# Patient Record
Sex: Female | Born: 1970 | Race: White | Hispanic: No | Marital: Married
Health system: Southern US, Community
[De-identification: ages and names within clinical notes are randomized; demographics above are authoritative.]

---

## 2000-03-30 ENCOUNTER — Other Ambulatory Visit: Admission: RE | Admit: 2000-03-30 | Discharge: 2000-03-30 | Payer: Self-pay | Admitting: Obstetrics & Gynecology

## 2000-10-02 ENCOUNTER — Inpatient Hospital Stay (HOSPITAL_COMMUNITY): Admission: AD | Admit: 2000-10-02 | Discharge: 2000-10-02 | Payer: Self-pay | Admitting: Obstetrics & Gynecology

## 2000-10-07 ENCOUNTER — Inpatient Hospital Stay (HOSPITAL_COMMUNITY): Admission: AD | Admit: 2000-10-07 | Discharge: 2000-10-07 | Payer: Self-pay | Admitting: Obstetrics and Gynecology

## 2000-10-25 ENCOUNTER — Inpatient Hospital Stay (HOSPITAL_COMMUNITY): Admission: AD | Admit: 2000-10-25 | Discharge: 2000-10-25 | Payer: Self-pay | Admitting: Obstetrics and Gynecology

## 2000-11-05 ENCOUNTER — Inpatient Hospital Stay (HOSPITAL_COMMUNITY): Admission: AD | Admit: 2000-11-05 | Discharge: 2000-11-06 | Payer: Self-pay | Admitting: Obstetrics & Gynecology

## 2003-02-13 ENCOUNTER — Other Ambulatory Visit: Admission: RE | Admit: 2003-02-13 | Discharge: 2003-02-13 | Payer: Self-pay | Admitting: Obstetrics and Gynecology

## 2009-09-20 ENCOUNTER — Emergency Department (HOSPITAL_COMMUNITY): Admission: EM | Admit: 2009-09-20 | Discharge: 2009-09-20 | Payer: Self-pay | Admitting: Emergency Medicine

## 2010-09-16 LAB — DIFFERENTIAL
Basophils Relative: 0 % (ref 0–1)
Eosinophils Relative: 0 % (ref 0–5)
Lymphocytes Relative: 18 % (ref 12–46)
Lymphs Abs: 1.4 10*3/uL (ref 0.7–4.0)
Neutro Abs: 6.1 10*3/uL (ref 1.7–7.7)
Neutrophils Relative %: 77 % (ref 43–77)

## 2010-09-16 LAB — CBC
Hemoglobin: 14.1 g/dL (ref 12.0–15.0)
MCHC: 33.8 g/dL (ref 30.0–36.0)
Platelets: 226 10*3/uL (ref 150–400)
RBC: 4.74 MIL/uL (ref 3.87–5.11)
RDW: 12.9 % (ref 11.5–15.5)

## 2010-09-16 LAB — POCT I-STAT, CHEM 8
BUN: 9 mg/dL (ref 6–23)
Calcium, Ion: 1.16 mmol/L (ref 1.12–1.32)
Chloride: 103 mEq/L (ref 96–112)
Creatinine, Ser: 0.7 mg/dL (ref 0.4–1.2)
HCT: 43 % (ref 36.0–46.0)
Hemoglobin: 14.6 g/dL (ref 12.0–15.0)
Sodium: 141 mEq/L (ref 135–145)
TCO2: 30 mmol/L (ref 0–100)

## 2010-09-16 LAB — POCT CARDIAC MARKERS: Myoglobin, poc: 38.3 ng/mL (ref 12–200)

## 2011-01-14 IMAGING — CT CT ANGIO CHEST
2 of 6 series · 20 of 36 positions shown · IV contrast (agent unspecified)
Comparison: Chest x-ray 09/20/2009.

CLINICAL DATA: Chest pain short of breath

CT ANGIOGRAPHY CHEST WITH CONTRAST
TECHNIQUE: Multidetector CT imaging of the chest was performed
using the standard protocol during bolus administration of
intravenous contrast.  Multiplanar CT image reconstructions
including MIPs were obtained to evaluate the vascular anatomy.
Contrast:  100 ml Tmnipaque-IKK IV.

[Series 10: pulm embolism 1.0 b25f thins · axial · 0.70mm/px · z∈[-202,-22]mm · 19 of 200 slices shown]
[im 10/200  lung]
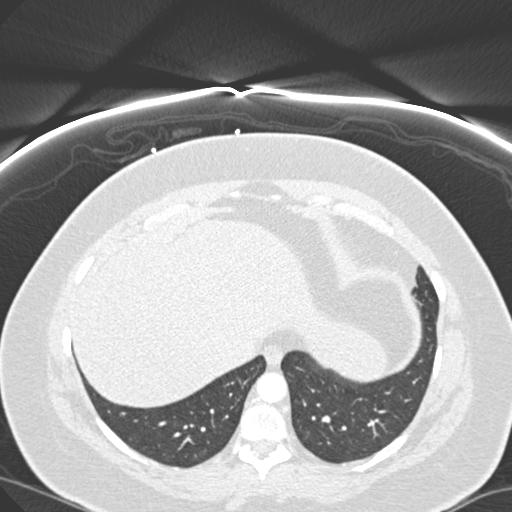
[im 20/200  mediastinal]
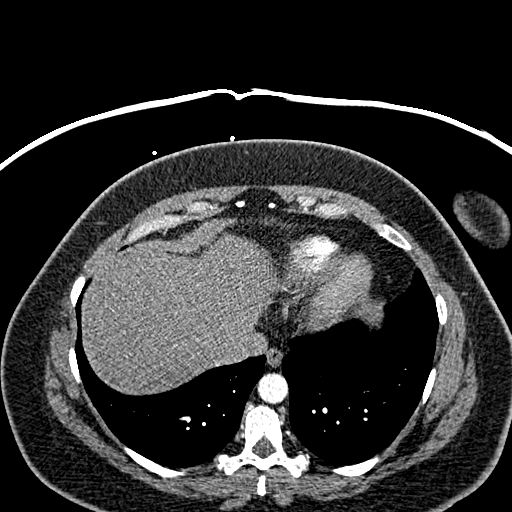
[im 30/200  lung]
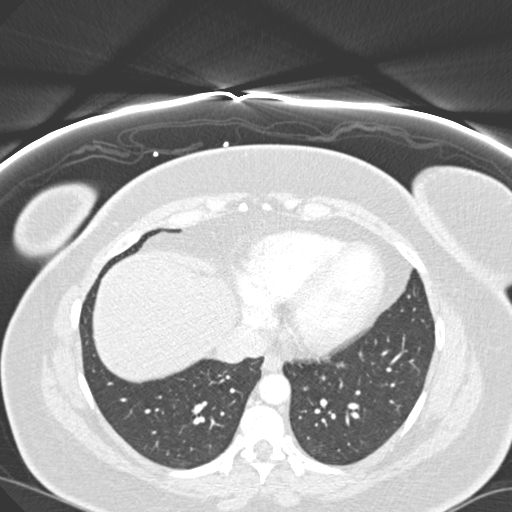
[im 40/200  mediastinal]
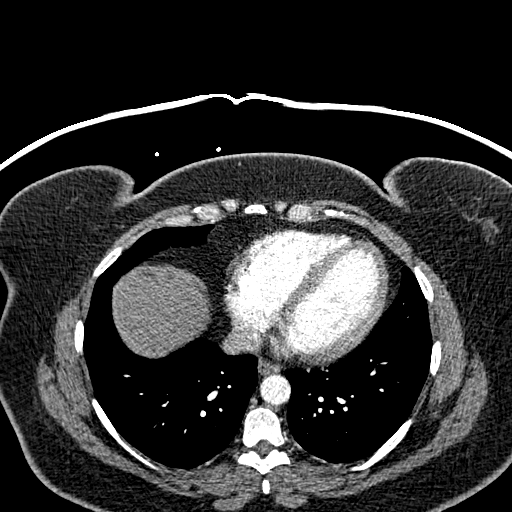
[im 50/200  lung]
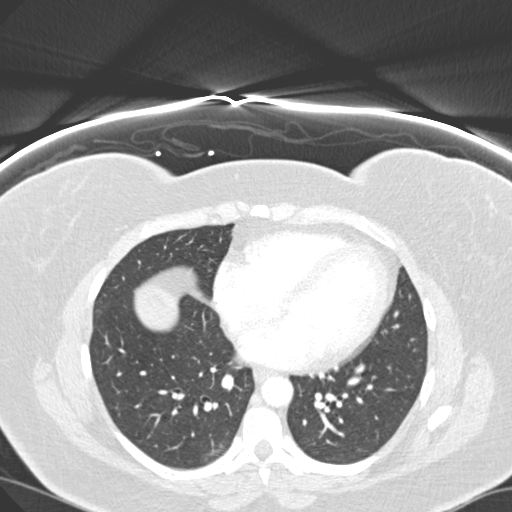
[im 60/200  mediastinal]
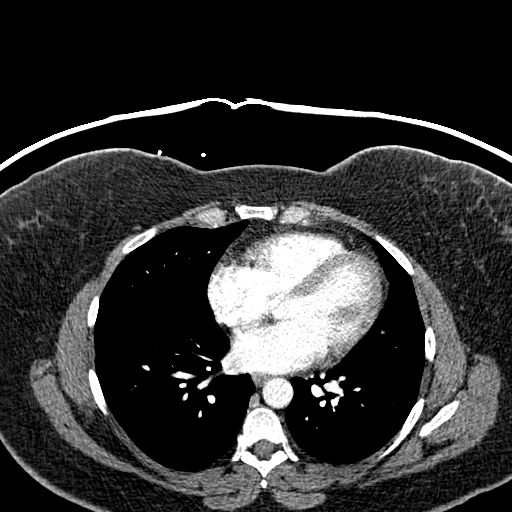
[im 70/200  lung]
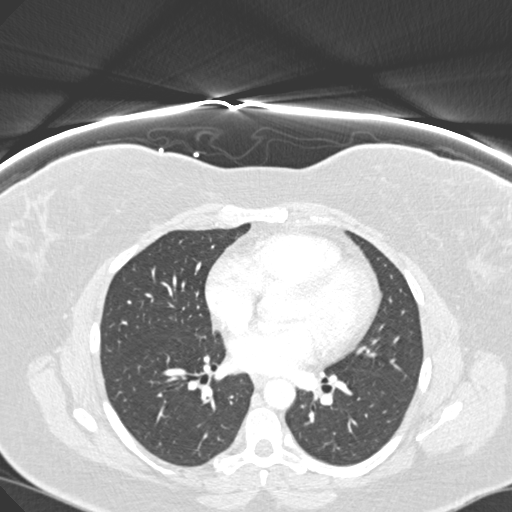
[im 80/200  mediastinal]
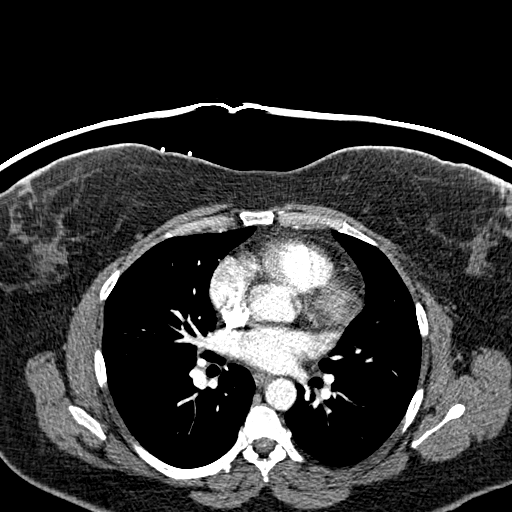
[im 90/200  lung]
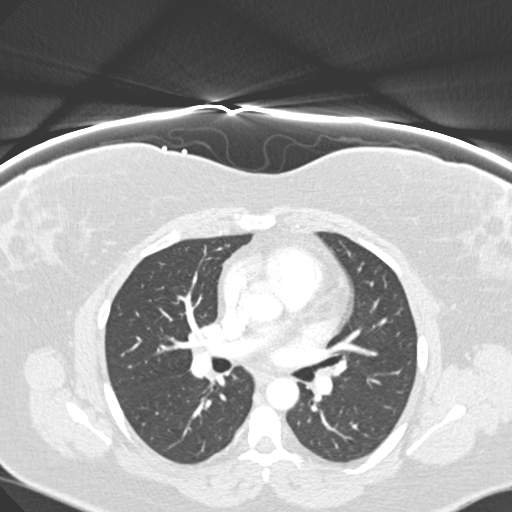
[im 100/200  mediastinal]
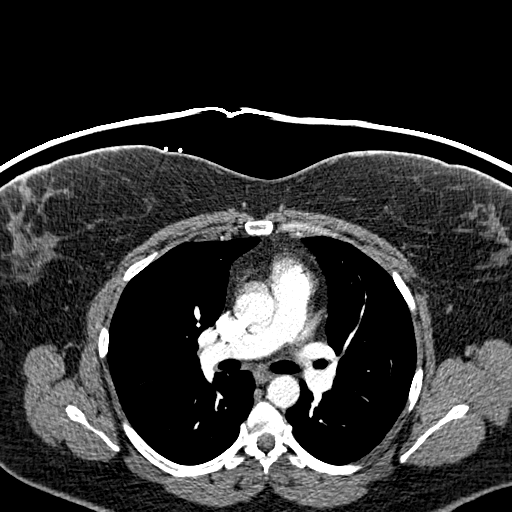
[im 110/200  lung]
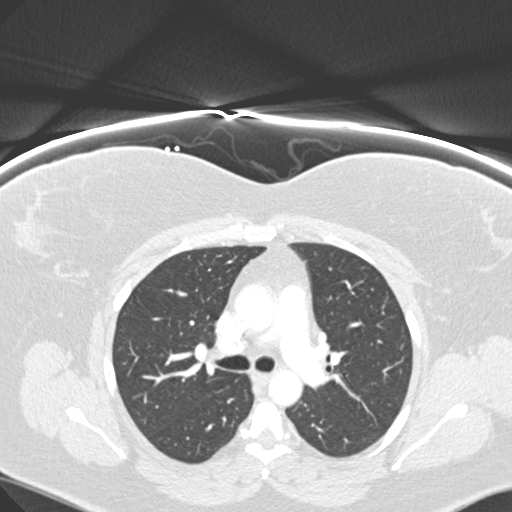
[im 120/200  mediastinal]
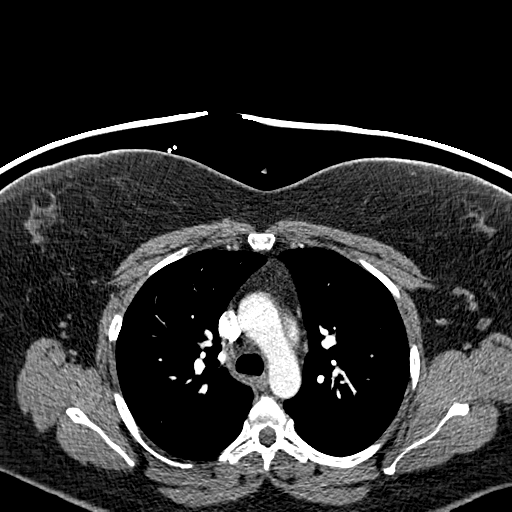
[im 130/200  lung]
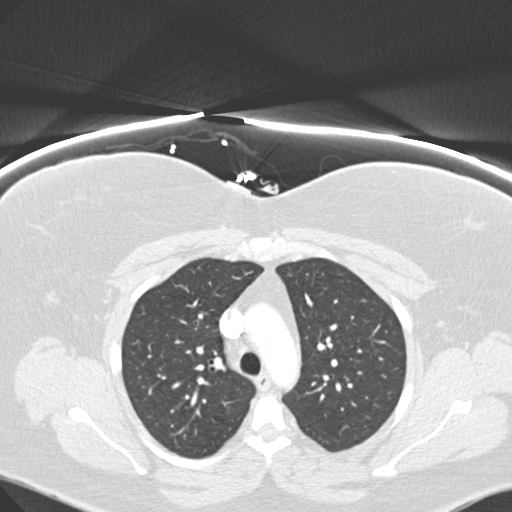
[im 140/200  mediastinal]
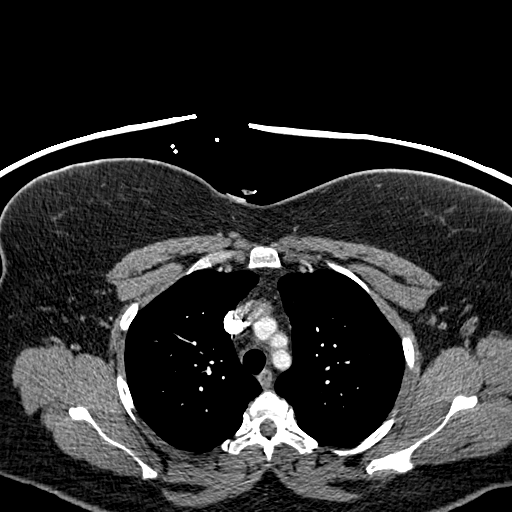
[im 150/200  lung]
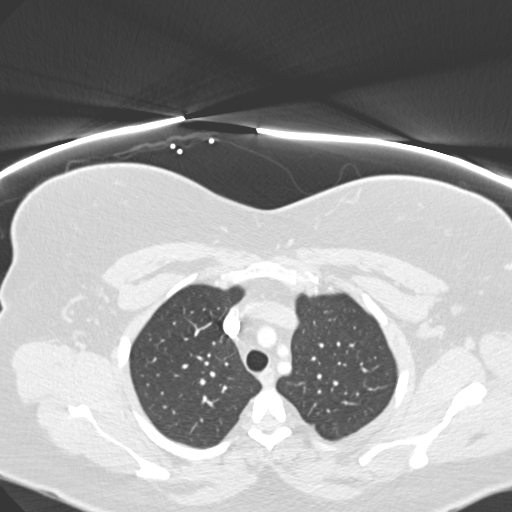
[im 160/200  mediastinal]
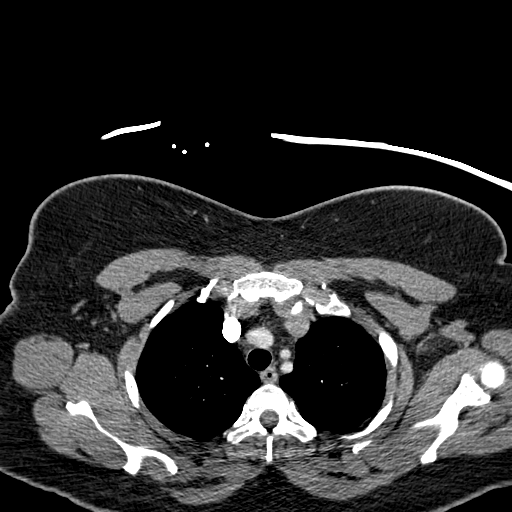
[im 170/200  lung]
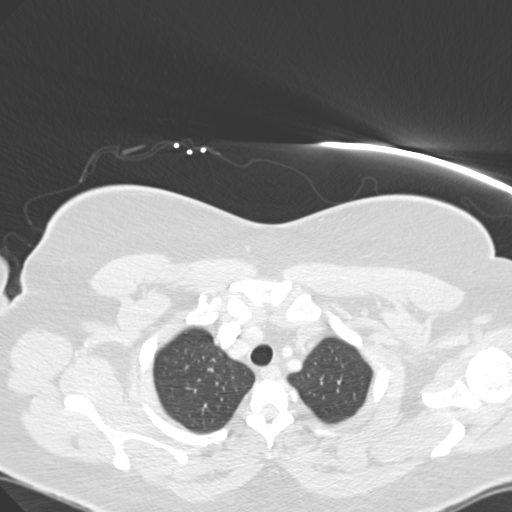
[im 180/200  mediastinal]
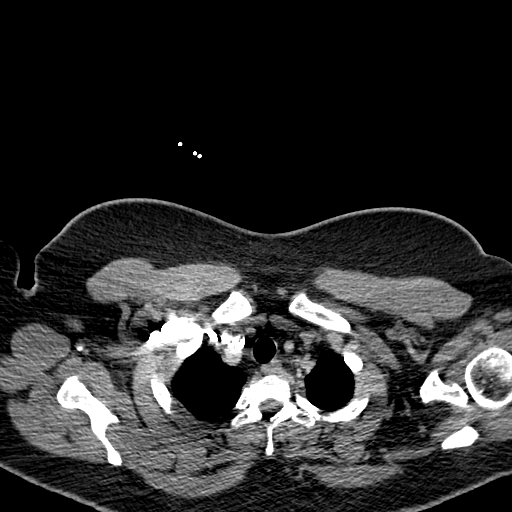
[im 190/200  lung]
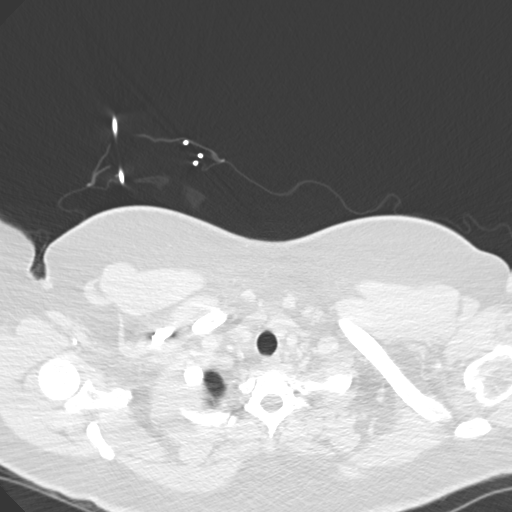

[Series 602: cor mpr · coronal · 0.70mm/px · 1 of 86 slices shown]
[im 43/86  mediastinal]
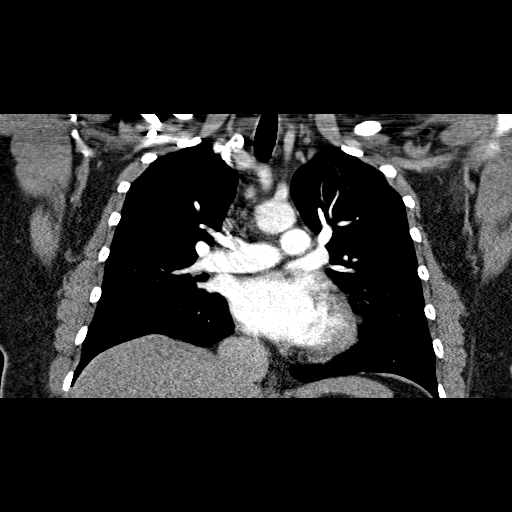

[20 of 36 positions shown; findings below may reference images not displayed]

FINDINGS: Negative for pulmonary embolism.  The thoracic aorta is
normal.  The heart appears normal.

The lungs are clear without infiltrate or effusion.  There is no
mass or adenopathy.

Review of the MIP images confirms the above findings.
IMPRESSION: Normal study.

## 2020-06-29 ENCOUNTER — Emergency Department (HOSPITAL_COMMUNITY)
Admission: EM | Admit: 2020-06-29 | Discharge: 2020-06-30 | Disposition: A | Discharge status: Home / Self Care | Payer: BC Managed Care – PPO | Attending: Emergency Medicine | Admitting: Emergency Medicine

## 2020-06-29 ENCOUNTER — Other Ambulatory Visit: Payer: Self-pay

## 2020-06-29 DIAGNOSIS — R110 Nausea: Secondary | ICD-10-CM | POA: Diagnosis present

## 2020-06-29 DIAGNOSIS — E876 Hypokalemia: Secondary | ICD-10-CM | POA: Diagnosis not present

## 2020-06-29 DIAGNOSIS — M6282 Rhabdomyolysis: Secondary | ICD-10-CM | POA: Diagnosis not present

## 2020-06-29 DIAGNOSIS — Z20822 Contact with and (suspected) exposure to covid-19: Secondary | ICD-10-CM | POA: Insufficient documentation

## 2020-06-29 DIAGNOSIS — I10 Essential (primary) hypertension: Secondary | ICD-10-CM | POA: Insufficient documentation

## 2020-06-29 DIAGNOSIS — R5383 Other fatigue: Secondary | ICD-10-CM | POA: Insufficient documentation

## 2020-06-29 DIAGNOSIS — E860 Dehydration: Secondary | ICD-10-CM

## 2020-06-29 NOTE — ED Triage Notes (Signed)
Pt here for multiple complaints; prior to EMS arrival, complained of nausea, SHOB, shaking, myalgia, weakness, headache, dry mouth & extreme thirst. On EMS arrival, dry mouth & thirst only remaining sx. Pt is prediabetic, hx htn  20g LAC VSS, NAD in triage

## 2020-06-30 ENCOUNTER — Emergency Department (HOSPITAL_COMMUNITY): Payer: BC Managed Care – PPO

## 2020-06-30 LAB — CBC
HCT: 39.8 % (ref 36.0–46.0)
Hemoglobin: 13.2 g/dL (ref 12.0–15.0)
MCH: 28.3 pg (ref 26.0–34.0)
MCHC: 33.2 g/dL (ref 30.0–36.0)
MCV: 85.4 fL (ref 80.0–100.0)
Platelets: 218 10*3/uL (ref 150–400)
RBC: 4.66 MIL/uL (ref 3.87–5.11)
RDW: 12.4 % (ref 11.5–15.5)
WBC: 13.2 10*3/uL — ABNORMAL HIGH (ref 4.0–10.5)
nRBC: 0 % (ref 0.0–0.2)

## 2020-06-30 LAB — I-STAT BETA HCG BLOOD, ED (MC, WL, AP ONLY): I-stat hCG, quantitative: <5 m[IU]/mL (ref <5)

## 2020-06-30 LAB — URINALYSIS, ROUTINE W REFLEX MICROSCOPIC
Bilirubin Urine: NEGATIVE
Glucose, UA: NEGATIVE mg/dL
Hgb urine dipstick: NEGATIVE
Ketones, ur: NEGATIVE mg/dL
Leukocytes,Ua: NEGATIVE
Nitrite: NEGATIVE
Protein, ur: NEGATIVE mg/dL
Specific Gravity, Urine: 1.012 (ref 1.005–1.030)
pH: 6 (ref 5.0–8.0)

## 2020-06-30 LAB — TROPONIN I (HIGH SENSITIVITY)
Troponin I (High Sensitivity): 3 ng/L (ref <18)
Troponin I (High Sensitivity): 3 ng/L (ref <18)

## 2020-06-30 LAB — COMPREHENSIVE METABOLIC PANEL
ALT: 22 U/L (ref 0–44)
AST: 17 U/L (ref 15–41)
Albumin: 3.8 g/dL (ref 3.5–5.0)
Alkaline Phosphatase: 74 U/L (ref 38–126)
Anion gap: 13 (ref 5–15)
BUN: 20 mg/dL (ref 6–20)
CO2: 25 mmol/L (ref 22–32)
Calcium: 8.7 mg/dL — ABNORMAL LOW (ref 8.9–10.3)
Chloride: 93 mmol/L — ABNORMAL LOW (ref 98–111)
Creatinine, Ser: 1.07 mg/dL — ABNORMAL HIGH (ref 0.44–1.00)
GFR, Estimated: >60 mL/min (ref >60)
Glucose, Bld: 207 mg/dL — ABNORMAL HIGH (ref 70–99)
Potassium: 2.9 mmol/L — ABNORMAL LOW (ref 3.5–5.1)
Sodium: 131 mmol/L — ABNORMAL LOW (ref 135–145)
Total Bilirubin: 0.5 mg/dL (ref 0.3–1.2)
Total Protein: 6.7 g/dL (ref 6.5–8.1)

## 2020-06-30 LAB — RESP PANEL BY RT-PCR (FLU A&B, COVID) ARPGX2
Influenza A by PCR: NEGATIVE
Influenza B by PCR: NEGATIVE
SARS Coronavirus 2 by RT PCR: NEGATIVE

## 2020-06-30 LAB — LIPASE, BLOOD: Lipase: 33 U/L (ref 11–51)

## 2020-06-30 LAB — CK: Total CK: 434 U/L — ABNORMAL HIGH (ref 38–234)

## 2020-06-30 MED ORDER — ONDANSETRON HCL 4 MG/2ML IJ SOLN
4.0000 mg | Freq: Once | INTRAMUSCULAR | Status: AC
  Administered 2020-06-30: 4 mg via INTRAVENOUS
  Filled 2020-06-30: qty 2

## 2020-06-30 MED ORDER — POTASSIUM CHLORIDE CRYS ER 20 MEQ PO TBCR
40.0000 meq | EXTENDED_RELEASE_TABLET | Freq: Once | ORAL | Status: AC
  Administered 2020-06-30: 40 meq via ORAL
  Filled 2020-06-30: qty 2

## 2020-06-30 MED ORDER — ONDANSETRON HCL 4 MG PO TABS: 4.0000 mg | ORAL_TABLET | Freq: Three times a day (TID) | ORAL | 0 refills | Status: AC | PRN

## 2020-06-30 MED ORDER — SODIUM CHLORIDE 0.9 % IV BOLUS
1000.0000 mL | Freq: Once | INTRAVENOUS | Status: AC
  Administered 2020-06-30: 1000 mL via INTRAVENOUS

## 2020-06-30 NOTE — Discharge Instructions (Signed)
Your work-up today showed that you were dehydrated causing all of your thirst, fatigue, and muscle soreness.  Your CK was slightly elevated suggestive of very mild and early rhabdomyolysis.  Your other labs are overall reassuring but your potassium was low again.  Please continue taking your home potassium medication and follow-up with your primary doctor for recheck of this.  Please rest and push hydration strongly over the next few days.  If any symptoms change or worsen, please return to the nearest emergency department.

## 2020-06-30 NOTE — ED Provider Notes (Signed)
[facility] EMERGENCY DEPARTMENT Provider Note   CSN: [id number] Arrival date & time: 06/29/20  2336     History Chief Complaint  Patient presents with  . multiple complaints    Dorothy Webster is a 50 y.o. female.  The history is provided by the patient and medical records. No language interpreter was used.  Illness Location:  General malaise and fatigue Quality:  All over Severity:  Severe Onset quality:  Gradual Duration:  1 day Timing:  Constant Progression:  Waxing and waning Chronicity:  New Associated symptoms: chest pain (resolved), fatigue, headaches (resolved), myalgias, nausea and shortness of breath (resolved)   Associated symptoms: no abdominal pain, no congestion, no cough, no diarrhea, no fever, no loss of consciousness, no rash, no rhinorrhea, no sore throat, no vomiting and no wheezing        No past medical history on file.  There are no problems to display for this patient.    OB History   No obstetric history on file.     No family history on file.     Home Medications Prior to Admission medications   Not on File    Allergies    Patient has no allergy information on record.  Review of Systems   Review of Systems  Constitutional: Positive for fatigue. Negative for chills, diaphoresis and fever.  HENT: Negative for congestion, rhinorrhea and sore throat.   Eyes: Negative for visual disturbance.  Respiratory: Positive for chest tightness and shortness of breath (resolved). Negative for cough and wheezing.   Cardiovascular: Positive for chest pain (resolved).  Gastrointestinal: Positive for nausea. Negative for abdominal pain, constipation, diarrhea and vomiting.  Genitourinary: Negative for decreased urine volume (darkened urine), dysuria, flank pain and frequency.  Musculoskeletal: Positive for back pain (resolved) and myalgias. Negative for neck pain and neck stiffness.  Skin: Negative for rash.  Neurological: Positive  for headaches (resolved). Negative for dizziness, tremors, seizures, loss of consciousness, facial asymmetry, speech difficulty, weakness and light-headedness.  Psychiatric/Behavioral: Negative for agitation and confusion.  All other systems reviewed and are negative.   Physical Exam Updated Vital Signs BP 131/88 (BP Location: Right Arm)   Pulse 79   Temp 98.4 F (36.9 C) (Oral)   Resp 16   SpO2 98%   Physical Exam Vitals and nursing note reviewed.  Constitutional:      General: She is not in acute distress.    Appearance: She is well-developed and well-nourished. She is not ill-appearing, toxic-appearing or diaphoretic.  HENT:     Head: Normocephalic and atraumatic.     Nose: Nose normal. No congestion or rhinorrhea.     Mouth/Throat:     Mouth: Mucous membranes are dry.     Pharynx: No oropharyngeal exudate or posterior oropharyngeal erythema.  Eyes:     Extraocular Movements: Extraocular movements intact.     Conjunctiva/sclera: Conjunctivae normal.     Pupils: Pupils are equal, round, and reactive to light.  Cardiovascular:     Rate and Rhythm: Normal rate and regular rhythm.     Pulses: Normal pulses.     Heart sounds: No murmur heard.   Pulmonary:     Effort: Pulmonary effort is normal. No respiratory distress.     Breath sounds: Normal breath sounds. No wheezing, rhonchi or rales.  Chest:     Chest wall: No tenderness.  Abdominal:     General: Abdomen is flat.     Palpations: Abdomen is soft.     Tenderness:  There is no abdominal tenderness. There is no right CVA tenderness or guarding.  Musculoskeletal:        General: No tenderness or edema.     Cervical back: Neck supple. No tenderness.     Right lower leg: No edema.     Left lower leg: No edema.  Skin:    General: Skin is warm and dry.     Findings: No erythema.  Neurological:     General: No focal deficit present.     Mental Status: She is alert.     Sensory: No sensory deficit.     Motor: No  weakness.  Psychiatric:        Mood and Affect: Mood and affect and mood normal.     ED Results / Procedures / Treatments   Labs (all labs ordered are listed, but only abnormal results are displayed) Labs Reviewed  COMPREHENSIVE METABOLIC PANEL - Abnormal; Notable for the following components:      Result Value   Sodium 131 (*)    Potassium 2.9 (*)    Chloride 93 (*)    Glucose, Bld 207 (*)    Creatinine, Ser 1.07 (*)    Calcium 8.7 (*)    All other components within normal limits  CBC - Abnormal; Notable for the following components:   WBC 13.2 (*)    All other components within normal limits  CK - Abnormal; Notable for the following components:   Total CK 434 (*)    All other components within normal limits  RESP PANEL BY RT-PCR (FLU A&B, COVID) [id number]  URINE CULTURE  LIPASE, BLOOD  URINALYSIS, ROUTINE W REFLEX MICROSCOPIC  I-STAT BETA HCG BLOOD, ED ([facility], [facility], [facility] ONLY)  TROPONIN I (HIGH SENSITIVITY)  TROPONIN I (HIGH SENSITIVITY)    EKG EKG Interpretation  Date/Time:  Saturday June 30 2020 14:32:22 EST Ventricular Rate:  69 PR Interval:    QRS Duration: 95 QT Interval:  479 QTC Calculation: 514 R Axis:   21 Text Interpretation: Sinus rhythm Nonspecific T abnormalities, anterior leads Prolonged QT interval When comapred to prior, longer QTc. No STEMI Confirmed by Theda Belfast ([id number]) on 06/30/2020 3:17:47 PM   Radiology DG Chest Portable 1 View  Result Date: 06/30/2020 CLINICAL DATA:  Chest pain, shortness of breath EXAM: PORTABLE CHEST 1 VIEW COMPARISON:  None. FINDINGS: The heart size and mediastinal contours are within normal limits. Both lungs are clear. No pleural effusion or pneumothorax. The visualized skeletal structures are unremarkable. IMPRESSION: No acute process in the chest. Electronically Signed   By: Guadlupe Spanish M.D.   On: 06/30/2020 12:57    Procedures Procedures (including critical care time)  Medications Ordered in ED Medications   potassium chloride SA (KLOR-CON) CR tablet 40 mEq (40 mEq Oral Given 06/30/20 1329)  sodium chloride 0.9 % bolus 1,000 mL (0 mLs Intravenous Stopped 06/30/20 1456)  ondansetron (ZOFRAN) injection 4 mg (4 mg Intravenous Given 06/30/20 1326)    ED Course  I have reviewed the triage vital signs and the nursing notes.  Pertinent labs & imaging results that were available during my care of the patient were reviewed by me and considered in my medical decision making (see chart for details).    MDM Rules/Calculators/A&P                          Dorothy Webster is a 50 y.o. female with a past medical history significant for hypertension who presents with  24 hours of multiple symptoms including a nausea, extreme thirst, dry mouth, darkened urine, malaise, fatigue, shaking, shortness of breath, chest tightness/pressure, and headache. Patient reports that she is not vaccinated against COVID and had COVID last January. She reports that she has had some mild soreness in her low back after doing yoga with a family member several days ago. She reports that she has had nausea but no vomiting but feels very dehydrated. She also had some myalgias and muscle soreness in all her muscles. She reports that she has not had any cough but has had chest pressure and tightness that waxes and wanes. It is currently gone. She reports now she only feels some nausea and feeling dehydrated. She has dry mouth. She denies any abdominal pain at this time. She denies any constipation or diarrhea. Reports her urine was darker but there is no dysuria. She did not report any neck pain or neck stiffness to me. No recent trauma. No fevers or chills.  On exam, lungs are clear and chest is nontender. Abdomen is nontender. Normal bowel sounds. Back is nontender on my exam. No focal neurologic deficits on my exam. Legs are nontender nonedematous. No murmur. Patient does have dry mouth on exam.  Of note, patient had weight approximately 13 hours  prior to my initial evaluation.  Patient had some screening labs at triage and while waiting in the emergency department waiting room. Patient was found to have low sodium and chloride as well as low potassium. She reports she has had hypokalemia in the past so she will be given some oral potassium at this time. She does have a leukocytosis of 13.2 but no anemia. She is not pregnant. Lipase not elevated. I suspect she is dehydrated however with the darkened urine, muscle soreness, and shaking, we will get a CK to look for rhabdo as well as checking for COVID. With her chest pressure she describes as coming and going for the last 24 hours, I will get a troponin, EKG, chest x-ray. We will give her some fluids and some nausea medicine.  Anticipate reassessment after work-up to determine disposition.  Work-up returned and did show slightly elevated CK.  Suspect mild early rhabdo.  Urinalysis does not show infection.  Troponin negative x2.  Chest x-ray reassuring, EKG reassuring with no evidence of STEMI.  Suspect dehydration from her nausea and decreased oral intake led to this mild rhabdo and muscle soreness.  Patient is feeling much better after fluids and nausea medicine.  She will be given prescription for Zofran and will push hydration at home.  She has home potassium supplementation after we gave her some potassium here.  She passed a p.o. challenge and felt much better with something on her stomach now.  She agrees with PCP follow-up for blood work recheck and reassessment.  She had no other questions or concerns and was discharged in good condition with improved symptoms.  Final Clinical Impression(s) / ED Diagnoses Final diagnoses:  Dehydration  Non-traumatic rhabdomyolysis  Fatigue, unspecified type  Nausea  Hypokalemia    Rx / DC Orders ED Discharge Orders         Ordered    ondansetron (ZOFRAN) 4 MG tablet  Every 8 hours PRN        06/30/20 1646         Clinical Impression: 1.  Dehydration   2. Non-traumatic rhabdomyolysis   3. Fatigue, unspecified type   4. Nausea   5. Hypokalemia     Disposition:  Discharge  Condition: Good  I have discussed the results, Dx and Tx plan with the pt(& family if present). He/she/they expressed understanding and agree(s) with the plan. Discharge instructions discussed at great length. Strict return precautions discussed and pt &/or family have verbalized understanding of the instructions. No further questions at time of discharge.    New Prescriptions   ONDANSETRON (ZOFRAN) 4 MG TABLET    Take 1 tablet (4 mg total) by mouth every 8 (eight) hours as needed.    [facility]     [facility] [address] [id number] [place] [id number] [phone]       Adi Seales, Canary Brimhristopher J, MD 06/30/20 [phone]

## 2020-06-30 NOTE — ED Notes (Signed)
Pt ambulated to bathroom without difficulty. Beverage and crackers given

## 2020-07-02 LAB — URINE CULTURE

## 2021-10-24 IMAGING — DX DG CHEST 1V PORT
1 series · 1 of 1 positions shown · non-contrast
Comparison: None.

CLINICAL DATA: Chest pain, shortness of breath

EXAM:
PORTABLE CHEST 1 VIEW

[chest]
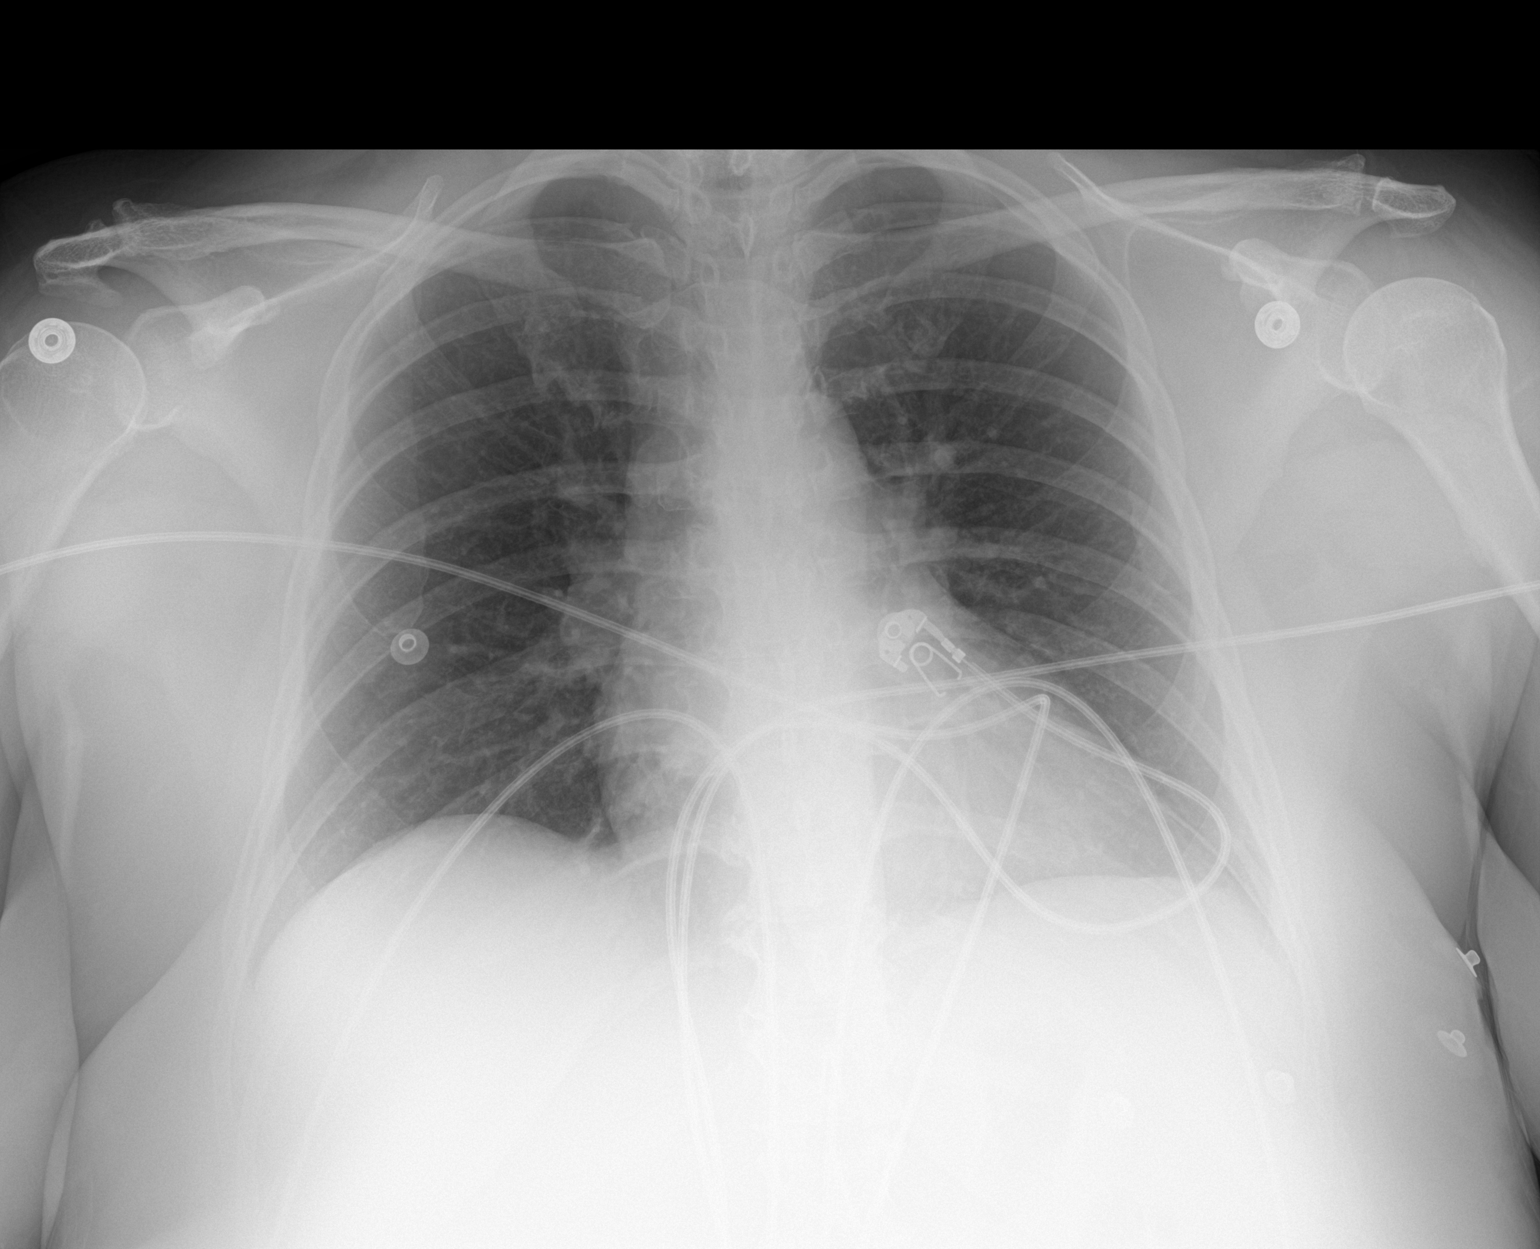

[1 of 1 positions shown; findings below may reference images not displayed]

FINDINGS: The heart size and mediastinal contours are within normal limits.
Both lungs are clear. No pleural effusion or pneumothorax. The
visualized skeletal structures are unremarkable.
IMPRESSION: No acute process in the chest.

## 2024-05-10 ENCOUNTER — Encounter: Payer: Self-pay | Admitting: Physical Therapy

## 2024-05-10 ENCOUNTER — Other Ambulatory Visit: Payer: Self-pay

## 2024-05-10 ENCOUNTER — Ambulatory Visit: Attending: Family Medicine | Admitting: Physical Therapy

## 2024-05-10 DIAGNOSIS — M25521 Pain in right elbow: Secondary | ICD-10-CM | POA: Insufficient documentation

## 2024-05-10 DIAGNOSIS — M542 Cervicalgia: Secondary | ICD-10-CM | POA: Diagnosis present

## 2024-05-10 DIAGNOSIS — M62838 Other muscle spasm: Secondary | ICD-10-CM | POA: Diagnosis present

## 2024-05-10 NOTE — Therapy (Signed)
 OUTPATIENT PHYSICAL THERAPY CERVICAL EVALUATION   Patient Name: Dorothy Webster MRN: [id number] DOB:07-25-1970, 53 y.o., female Today's Date: 05/10/2024  END OF SESSION:  PT End of Session - 05/10/24 0925     Visit Number 1    Number of Visits 12    Date for Recertification  06/21/24    PT Start Time 0848    PT Stop Time 0915    PT Time Calculation (min) 27 min    Activity Tolerance Patient tolerated treatment well    Behavior During Therapy [facility] for tasks assessed/performed          History reviewed. No pertinent past medical history. History reviewed. No pertinent surgical history. There are no active problems to display for this patient.    REFERRING PROVIDER: Lamar Cornet MD  REFERRING DIAG: Trapezius muscle strain.  Lateral epicondylitis.    THERAPY DIAG:  Pain in right elbow  Other muscle spasm  Cervicalgia  Rationale for Evaluation and Treatment: Rehabilitation  ONSET DATE: August.    SUBJECTIVE:                                                                                                                                                                                                         SUBJECTIVE STATEMENT: The patients to the clinic with c/o right-sided neck and elbow pain (she is wearing an Tennis elbow brace).  She states the pain came on in August and her neck locked up.  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  Her pain is rated at 7-8/10 and described as an ache, sore, throbbing and sharp.  Rest and heat decreases her pain and increased activity increases her pain.   PERTINENT HISTORY:  HTN.    PAIN:  Are you having pain? Yes: NPRS scale: 7-8/10. Pain location: Right cervical, right elbow.   Pain description: As above.   Aggravating factors: As above.   Relieving factors: As above.    PRECAUTIONS: None  RED FLAGS: None     WEIGHT  BEARING RESTRICTIONS: No  FALLS:  Has patient fallen in last 6 months? No  LIVING ENVIRONMENT: Lives in: House/apartment Has following equipment at home: None  OCCUPATION: Investment banker, corporate.  PLOF: Independent  PATIENT GOALS: Not have pain.      OBJECTIVE:   PATIENT SURVEYS:  QUICKDASH:  38 points (61.4%).   POSTURE: forward head  PALPATION: Very tender with a great deal of tone over the patient's  right UT and very tender to palpation over the right lateral epicondyle.     CERVICAL ROM:   Active right cervical rotation is 60 degrees and left is 70 degrees.  UPPER EXTREMITY MMT:  WNL for bilateral shoulders.     TREATMENT DATE:                                                                                                                                  PATIENT EDUCATION:  Education details: Discussed correct posture.   Person educated: Patient Education method: Medical Illustrator Education comprehension: verbalized understanding and returned demonstration  HOME EXERCISE PROGRAM:   ASSESSMENT:  CLINICAL IMPRESSION: The patient presents to OPPT with c/o right-sided neck and elbow pain. She is very palpably tender over her right UT that exhibits a great deal of tone.  Her right lateral epicondylar region is also very tender to palpation.  She lacks active bilateral cervical rotation.  Her QUICKDASH score is QUICKDASH:  38 points (61.4%).  Patient will benefit from skilled physical therapy intervention to address pain and deficits.  OBJECTIVE IMPAIRMENTS: decreased activity tolerance, decreased ROM, increased muscle spasms, postural dysfunction, and pain.   ACTIVITY LIMITATIONS: carrying and lifting  PARTICIPATION LIMITATIONS: meal prep, cleaning, laundry, and occupation.  REHAB POTENTIAL: Good  CLINICAL DECISION MAKING: Evolving/moderate complexity  EVALUATION COMPLEXITY: Moderate   GOALS:   LONG TERM GOALS: Target date: 06/21/24  Ind  with HEP.  Goal status: INITIAL  2.  Improve bilateral active cervical rotation to 75 degrees so she can turn her head more easily while driving.  Goal status: INITIAL  3.  Perform ADL's with neck and right elbow pain not > 2-3/10.  Goal status: INITIAL  4.  Improve QUICKDASH score by at least 7 points.  Goal status: INITIAL   PLAN:  PT FREQUENCY: 2x/week  PT DURATION: 6 weeks  PLANNED INTERVENTIONS: 97110-Therapeutic exercises, 97530- Therapeutic activity, [id number]- Neuromuscular re-education, 97535- Self Care, 02859- Manual therapy, G0283- Electrical stimulation (unattended), 97035- Ultrasound, Patient/Family education, Cryotherapy, and Moist heat  PLAN FOR NEXT SESSION: Posture exercises.  Modalities and STW/M as needed.     Baine Decesare, PT 05/10/2024, 10:01 AM

## 2024-05-17 ENCOUNTER — Ambulatory Visit: Admitting: Physical Therapy

## 2024-05-17 DIAGNOSIS — M62838 Other muscle spasm: Secondary | ICD-10-CM

## 2024-05-17 DIAGNOSIS — M542 Cervicalgia: Secondary | ICD-10-CM

## 2024-05-17 DIAGNOSIS — M25521 Pain in right elbow: Secondary | ICD-10-CM

## 2024-05-17 NOTE — Therapy (Signed)
 OUTPATIENT PHYSICAL THERAPY CERVICAL TREATMENT   Patient Name: Dorothy Webster MRN: [id number] DOB:05/05/1971, 53 y.o., female Today's Date: 05/17/2024  END OF SESSION:  PT End of Session - 05/17/24 1003     Visit Number 2    Number of Visits 12    Date for Recertification  06/21/24    PT Start Time 0845    PT Stop Time 0932    PT Time Calculation (min) 47 min    Activity Tolerance Patient tolerated treatment well    Behavior During Therapy [facility] for tasks assessed/performed          No past medical history on file. No past surgical history on file. There are no active problems to display for this patient.    REFERRING PROVIDER: Lamar Cornet MD  REFERRING DIAG: Trapezius muscle strain.  Lateral epicondylitis.    THERAPY DIAG:  No diagnosis found.  Rationale for Evaluation and Treatment: Rehabilitation  ONSET DATE: August.    SUBJECTIVE:                                                                                                                                                                                                         SUBJECTIVE STATEMENT: No new complaints. PERTINENT HISTORY:  HTN.    PAIN:  Are you having pain? Yes: NPRS scale: 7-8/10. Pain location: Right cervical, right elbow.   Pain description: As above.   Aggravating factors: As above.   Relieving factors: As above.    PRECAUTIONS: None  RED FLAGS: None     WEIGHT BEARING RESTRICTIONS: No  FALLS:  Has patient fallen in last 6 months? No  LIVING ENVIRONMENT: Lives in: House/apartment Has following equipment at home: None  OCCUPATION: Investment banker, corporate.  PLOF: Independent  PATIENT GOALS: Not have pain.      OBJECTIVE:   PATIENT SURVEYS:  QUICKDASH:  38 points (61.4%).   POSTURE: forward head  PALPATION: Very tender with a great deal of tone over the patient's right UT and very tender to palpation over the right lateral epicondyle.     CERVICAL ROM:   Active right  cervical rotation is 60 degrees and left is 70 degrees.  UPPER EXTREMITY MMT:  WNL for bilateral shoulders.     TREATMENT DATE:  04/2524:  UBE at 120 RPM's x 8 minutes (4 mins forward and 4 mins backward) f/b STW/M x 15 minutes including ischemic release technique f/b HMP x 15 minutes.     PATIENT EDUCATION:  Education details: Discussed correct posture.   Person educated: Patient Education method: Medical Illustrator Education comprehension: verbalized understanding and returned demonstration  HOME EXERCISE PROGRAM:   ASSESSMENT:  CLINICAL IMPRESSION: Patient's right UT exhibits a great deal of tone.  She may have access to a TENS unit.  Asked her to bring it in for instruction in usage.    OBJECTIVE IMPAIRMENTS: decreased activity tolerance, decreased ROM, increased muscle spasms, postural dysfunction, and pain.   ACTIVITY LIMITATIONS: carrying and lifting  PARTICIPATION LIMITATIONS: meal prep, cleaning, laundry, and occupation.  REHAB POTENTIAL: Good  CLINICAL DECISION MAKING: Evolving/moderate complexity  EVALUATION COMPLEXITY: Moderate   GOALS:   LONG TERM GOALS: Target date: 06/21/24  Ind with HEP.  Goal status: INITIAL  2.  Improve bilateral active cervical rotation to 75 degrees so she can turn her head more easily while driving.  Goal status: INITIAL  3.  Perform ADL's with neck and right elbow pain not > 2-3/10.  Goal status: INITIAL  4.  Improve QUICKDASH score by at least 7 points.  Goal status: INITIAL   PLAN:  PT FREQUENCY: 2x/week  PT DURATION: 6 weeks  PLANNED INTERVENTIONS: 97110-Therapeutic exercises, 97530- Therapeutic activity, [id number]- Neuromuscular re-education, 97535- Self Care, 02859- Manual therapy, G0283- Electrical stimulation (unattended), 97035- Ultrasound, Patient/Family education,  Cryotherapy, and Moist heat  PLAN FOR NEXT SESSION: Posture exercises.  Modalities and STW/M as needed.     Tychelle Purkey, PT 05/17/2024, 10:15 AM

## 2024-05-27 ENCOUNTER — Encounter: Payer: Self-pay | Admitting: Physical Therapy

## 2024-05-27 ENCOUNTER — Ambulatory Visit: Attending: Family Medicine | Admitting: Physical Therapy

## 2024-05-27 DIAGNOSIS — M62838 Other muscle spasm: Secondary | ICD-10-CM | POA: Insufficient documentation

## 2024-05-27 DIAGNOSIS — M25521 Pain in right elbow: Secondary | ICD-10-CM | POA: Diagnosis present

## 2024-05-27 DIAGNOSIS — M542 Cervicalgia: Secondary | ICD-10-CM | POA: Insufficient documentation

## 2024-05-27 NOTE — Therapy (Signed)
 OUTPATIENT PHYSICAL THERAPY CERVICAL TREATMENT   Patient Name: Dorothy Webster MRN: [id number] DOB:29-Oct-1970, 53 y.o., female Today's Date: 05/27/2024  END OF SESSION:  PT End of Session - 05/27/24 1242     Visit Number 3    Number of Visits 12    Date for Recertification  06/21/24    PT Start Time 1100    PT Stop Time 1146    PT Time Calculation (min) 46 min    Activity Tolerance Patient tolerated treatment well    Behavior During Therapy [facility] for tasks assessed/performed          History reviewed. No pertinent past medical history. History reviewed. No pertinent surgical history. There are no active problems to display for this patient.    REFERRING PROVIDER: Lamar Cornet MD  REFERRING DIAG: Trapezius muscle strain.  Lateral epicondylitis.    THERAPY DIAG:  Pain in right elbow  Other muscle spasm  Cervicalgia  Rationale for Evaluation and Treatment: Rehabilitation  ONSET DATE: August.    SUBJECTIVE:                                                                                                                                                                                                         SUBJECTIVE STATEMENT: Neck pain a 6 and right elbow an 8.   PERTINENT HISTORY:  HTN.    PAIN:  Are you having pain? Yes: NPRS scale: See above/10. Pain location: Right cervical, right elbow.   Pain description: As above.   Aggravating factors: As above.   Relieving factors: As above.    PRECAUTIONS: None  RED FLAGS: None     WEIGHT BEARING RESTRICTIONS: No  FALLS:  Has patient fallen in last 6 months? No  LIVING ENVIRONMENT: Lives in: House/apartment Has following equipment at home: None  OCCUPATION: Investment banker, corporate.  PLOF: Independent  PATIENT GOALS: Not have pain.      OBJECTIVE:   PATIENT SURVEYS:  QUICKDASH:  38 points (61.4%).   POSTURE: forward head  PALPATION: Very tender with a great deal of tone over the patient's right UT and  very tender to palpation over the right lateral epicondyle.     CERVICAL ROM:   Active right cervical rotation is 60 degrees and left is 70 degrees.  UPPER EXTREMITY MMT:  WNL for bilateral shoulders.     TREATMENT DATE:     05/27/24:     UBE at 120 RPM's x 8 minutes f/b STW/M x 30 minuts to patient's right UT and right extensor forearm musculature.  05/17/24:  UBE at 120 RPM's x 8 minutes (4 mins forward and 4 mins backward) f/b STW/M x 15 minutes including ischemic release technique f/b HMP x 15 minutes.     PATIENT EDUCATION:  Education details: Discussed correct posture.   Person educated: Patient Education method: Medical Illustrator Education comprehension: verbalized understanding and returned demonstration  HOME EXERCISE PROGRAM:   ASSESSMENT:  CLINICAL IMPRESSION: Patient continues to a great deal of pain over her right UT and she continues to be very palpably tender over her right forearm extensor musculature and she was also found to be very tender over her right Brachioradialis.    OBJECTIVE IMPAIRMENTS: decreased activity tolerance, decreased ROM, increased muscle spasms, postural dysfunction, and pain.   ACTIVITY LIMITATIONS: carrying and lifting  PARTICIPATION LIMITATIONS: meal prep, cleaning, laundry, and occupation.  REHAB POTENTIAL: Good  CLINICAL DECISION MAKING: Evolving/moderate complexity  EVALUATION COMPLEXITY: Moderate   GOALS:   LONG TERM GOALS: Target date: 06/21/24  Ind with HEP.  Goal status: INITIAL  2.  Improve bilateral active cervical rotation to 75 degrees so she can turn her head more easily while driving.  Goal status: INITIAL  3.  Perform ADL's with neck and right elbow pain not > 2-3/10.  Goal status: INITIAL  4.  Improve QUICKDASH score by at least 7 points.  Goal status:  INITIAL   PLAN:  PT FREQUENCY: 2x/week  PT DURATION: 6 weeks  PLANNED INTERVENTIONS: 97110-Therapeutic exercises, 97530- Therapeutic activity, [id number]- Neuromuscular re-education, 97535- Self Care, 02859- Manual therapy, G0283- Electrical stimulation (unattended), 97035- Ultrasound, Patient/Family education, Cryotherapy, and Moist heat  PLAN FOR NEXT SESSION: Posture exercises.  Modalities and STW/M as needed.     Shernell Saldierna, PT 05/27/2024, 12:45 PM

## 2024-06-03 ENCOUNTER — Ambulatory Visit: Admitting: *Deleted

## 2024-06-03 ENCOUNTER — Encounter: Payer: Self-pay | Admitting: *Deleted

## 2024-06-03 DIAGNOSIS — M25521 Pain in right elbow: Secondary | ICD-10-CM | POA: Diagnosis not present

## 2024-06-03 DIAGNOSIS — M542 Cervicalgia: Secondary | ICD-10-CM

## 2024-06-03 DIAGNOSIS — M62838 Other muscle spasm: Secondary | ICD-10-CM

## 2024-06-03 NOTE — Therapy (Signed)
 OUTPATIENT PHYSICAL THERAPY CERVICAL TREATMENT   Patient Name: Dorothy Webster MRN: [id number] DOB:06/07/1971, 53 y.o., female Today's Date: 06/03/2024  END OF SESSION:  PT End of Session - 06/03/24 0856     Visit Number 4    Number of Visits 12    Date for Recertification  06/21/24    PT Start Time 0845    PT Stop Time 0935    PT Time Calculation (min) 50 min          History reviewed. No pertinent past medical history. History reviewed. No pertinent surgical history. There are no active problems to display for this patient.    REFERRING PROVIDER: Lamar Cornet MD  REFERRING DIAG: Trapezius muscle strain.  Lateral epicondylitis.    THERAPY DIAG:  Pain in right elbow  Other muscle spasm  Cervicalgia  Rationale for Evaluation and Treatment: Rehabilitation  ONSET DATE: August.    SUBJECTIVE:                                                                                                                                                                                                         SUBJECTIVE STATEMENT: Neck pain a 4/10 and right elbow an 5-6/10.   PERTINENT HISTORY:  HTN.    PAIN:  Are you having pain? Yes: NPRS scale: See above/10. Pain location: Right cervical, right elbow.   Pain description: As above.   Aggravating factors: As above.   Relieving factors: As above.    PRECAUTIONS: None  RED FLAGS: None     WEIGHT BEARING RESTRICTIONS: No  FALLS:  Has patient fallen in last 6 months? No  LIVING ENVIRONMENT: Lives in: House/apartment Has following equipment at home: None  OCCUPATION: Investment banker, corporate.  PLOF: Independent  PATIENT GOALS: Not have pain.      OBJECTIVE:   PATIENT SURVEYS:  QUICKDASH:  38 points (61.4%).   POSTURE: forward head  PALPATION: Very tender with a great deal of tone over the patient's right UT and very tender to palpation over the right lateral epicondyle.     CERVICAL ROM:   Active right cervical  rotation is 60 degrees and left is 70 degrees.  UPPER EXTREMITY MMT:  WNL for bilateral shoulders.     TREATMENT DATE:     06/03/24:     UBE at 120 RPM's x 8 minutes  STW/IASTM/ TPR  x 30 min.s to patient's right UT and  STW/IASTM right extensor forearm musculature. HMP to both areas x 10 mins  05/17/24:  UBE at 120 RPM's x 8 minutes (4 mins forward and 4 mins backward) f/b STW/M x 15 minutes including ischemic release technique f/b HMP x 15 minutes.     PATIENT EDUCATION:  Education details: Discussed correct posture.   Person educated: Patient Education method: Medical Illustrator Education comprehension: verbalized understanding and returned demonstration  HOME EXERCISE PROGRAM:   ASSESSMENT:  CLINICAL IMPRESSION: Patient continues to have a great deal of pain over her right UT and RT forearm. Rx focused on therex as well as light STW. She continues to be very palpably tender over her right forearm extensor musculature and she was also found to be very tender over her right Brachioradialis.    OBJECTIVE IMPAIRMENTS: decreased activity tolerance, decreased ROM, increased muscle spasms, postural dysfunction, and pain.   ACTIVITY LIMITATIONS: carrying and lifting  PARTICIPATION LIMITATIONS: meal prep, cleaning, laundry, and occupation.  REHAB POTENTIAL: Good  CLINICAL DECISION MAKING: Evolving/moderate complexity  EVALUATION COMPLEXITY: Moderate   GOALS:   LONG TERM GOALS: Target date: 06/21/24  Ind with HEP.  Goal status: INITIAL  2.  Improve bilateral active cervical rotation to 75 degrees so she can turn her head more easily while driving.  Goal status: INITIAL  3.  Perform ADL's with neck and right elbow pain not > 2-3/10.  Goal status: INITIAL  4.  Improve QUICKDASH score by at least 7 points.  Goal status:  INITIAL   PLAN:  PT FREQUENCY: 2x/week  PT DURATION: 6 weeks  PLANNED INTERVENTIONS: 97110-Therapeutic exercises, 97530- Therapeutic activity, [id number]- Neuromuscular re-education, 97535- Self Care, 02859- Manual therapy, G0283- Electrical stimulation (unattended), 97035- Ultrasound, Patient/Family education, Cryotherapy, and Moist heat  PLAN FOR NEXT SESSION: Posture exercises.   No modalities,  STW/M as needed.     Jearld Hemp,CHRIS, PTA 06/03/2024, 9:44 AM

## 2024-06-09 ENCOUNTER — Ambulatory Visit: Admitting: *Deleted

## 2024-06-09 DIAGNOSIS — M62838 Other muscle spasm: Secondary | ICD-10-CM

## 2024-06-09 DIAGNOSIS — M542 Cervicalgia: Secondary | ICD-10-CM

## 2024-06-09 DIAGNOSIS — M25521 Pain in right elbow: Secondary | ICD-10-CM | POA: Diagnosis not present

## 2024-06-09 NOTE — Therapy (Signed)
 OUTPATIENT PHYSICAL THERAPY CERVICAL TREATMENT   Patient Name: Dorothy Webster MRN: [id number] DOB:1971/05/09, 53 y.o., female Today's Date: 06/09/2024  END OF SESSION:  PT End of Session - 06/09/24 1258     Visit Number 5    Number of Visits 12    Date for Recertification  06/21/24    PT Start Time 0645    PT Stop Time 0930    PT Time Calculation (min) 165 min          No past medical history on file. No past surgical history on file. There are no active problems to display for this patient.    REFERRING PROVIDER: Lamar Cornet MD  REFERRING DIAG: Trapezius muscle strain.  Lateral epicondylitis.    THERAPY DIAG:  Pain in right elbow  Other muscle spasm  Cervicalgia  Rationale for Evaluation and Treatment: Rehabilitation  ONSET DATE: August.    SUBJECTIVE:                                                                                                                                                                                                         SUBJECTIVE STATEMENT: Neck pain a 3-4/10 and right elbow an 5-6/10. RT Utrap doing some better   PERTINENT HISTORY:  HTN.    PAIN:  Are you having pain? Yes: NPRS scale: See above/10. Pain location: Right cervical, right elbow.   Pain description: As above.   Aggravating factors: As above.   Relieving factors: As above.    PRECAUTIONS: None  RED FLAGS: None     WEIGHT BEARING RESTRICTIONS: No  FALLS:  Has patient fallen in last 6 months? No  LIVING ENVIRONMENT: Lives in: House/apartment Has following equipment at home: None  OCCUPATION: Investment banker, corporate.  PLOF: Independent  PATIENT GOALS: Not have pain.      OBJECTIVE:   PATIENT SURVEYS:  QUICKDASH:  38 points (61.4%).   POSTURE: forward head  PALPATION: Very tender with a great deal of tone over the patient's right UT and very tender to palpation over the right lateral epicondyle.     CERVICAL ROM:   Active right cervical rotation  is 60 degrees and left is 70 degrees.  UPPER EXTREMITY MMT:  WNL for bilateral shoulders.     TREATMENT DATE:     06/09/24:  Seated UE ranger x 4 mins each side  ( 8 mins )     STW/IASTM/ TPR  x 30 min.s to patient's right UT and  STW/IASTM right extensor forearm musculature. HMP to RT elbow  x 15 mins  05/17/24:  UBE at 120 RPM's x 8 minutes (4 mins forward and 4 mins backward) f/b STW/M x 15 minutes including ischemic release technique f/b HMP x 15 minutes.     PATIENT EDUCATION:  Education details: Discussed correct posture.   Person educated: Patient Education method: Medical Illustrator Education comprehension: verbalized understanding and returned demonstration  HOME EXERCISE PROGRAM:   ASSESSMENT:  CLINICAL IMPRESSION: Patient continues to have a great deal of pain over her right UT and RT forearm. Rx focused on therex as well as light STW. She continues to be very palpably tender over her right forearm extensor musculature. Pt did have a good release in RT Utrap, but forearm continues to be very painful.    OBJECTIVE IMPAIRMENTS: decreased activity tolerance, decreased ROM, increased muscle spasms, postural dysfunction, and pain.   ACTIVITY LIMITATIONS: carrying and lifting  PARTICIPATION LIMITATIONS: meal prep, cleaning, laundry, and occupation.  REHAB POTENTIAL: Good  CLINICAL DECISION MAKING: Evolving/moderate complexity  EVALUATION COMPLEXITY: Moderate   GOALS:   LONG TERM GOALS: Target date: 06/21/24  Ind with HEP.  Goal status: INITIAL  2.  Improve bilateral active cervical rotation to 75 degrees so she can turn her head more easily while driving.  Goal status: INITIAL  3.  Perform ADL's with neck and right elbow pain not > 2-3/10.  Goal status: INITIAL  4.  Improve QUICKDASH score by at least 7 points.  Goal status:  INITIAL   PLAN:  PT FREQUENCY: 2x/week  PT DURATION: 6 weeks  PLANNED INTERVENTIONS: 97110-Therapeutic exercises, 97530- Therapeutic activity, [id number]- Neuromuscular re-education, 97535- Self Care, 02859- Manual therapy, G0283- Electrical stimulation (unattended), 97035- Ultrasound, Patient/Family education, Cryotherapy, and Moist heat  PLAN FOR NEXT SESSION: Posture exercises.   No modalities,  STW/M as needed.     Meliya Mcconahy,CHRIS, PTA 06/09/2024, 1:35 PM

## 2024-06-13 ENCOUNTER — Ambulatory Visit: Admitting: *Deleted

## 2024-06-21 ENCOUNTER — Ambulatory Visit: Admitting: *Deleted

## 2024-06-21 ENCOUNTER — Encounter: Payer: Self-pay | Admitting: *Deleted

## 2024-06-21 DIAGNOSIS — M62838 Other muscle spasm: Secondary | ICD-10-CM

## 2024-06-21 DIAGNOSIS — M542 Cervicalgia: Secondary | ICD-10-CM

## 2024-06-21 DIAGNOSIS — M25521 Pain in right elbow: Secondary | ICD-10-CM | POA: Diagnosis not present

## 2024-06-21 NOTE — Therapy (Signed)
 " OUTPATIENT PHYSICAL THERAPY CERVICAL TREATMENT   Patient Name: Dorothy Webster MRN: [id number] DOB:May 13, 1971, 53 y.o., female Today's Date: 06/21/2024  END OF SESSION:  PT End of Session - 06/21/24 0856     Visit Number 6    Number of Visits 12    Date for Recertification  06/21/24    PT Start Time 0850    PT Stop Time 0945    PT Time Calculation (min) 55 min          History reviewed. No pertinent past medical history. History reviewed. No pertinent surgical history. There are no active problems to display for this patient.    REFERRING PROVIDER: Lamar Cornet MD  REFERRING DIAG: Trapezius muscle strain.  Lateral epicondylitis.    THERAPY DIAG:  Pain in right elbow  Other muscle spasm  Cervicalgia  Rationale for Evaluation and Treatment: Rehabilitation  ONSET DATE: August.    SUBJECTIVE:                                                                                                                                                                                                         SUBJECTIVE STATEMENT: Neck pain a 3-4/10 and right elbow an 5/10. RT Utrap doing some better. Want to do MRI PERTINENT HISTORY:  HTN.    PAIN:  Are you having pain? Yes: NPRS scale: See above/10. Pain location: Right cervical, right elbow.   Pain description: As above.   Aggravating factors: As above.   Relieving factors: As above.    PRECAUTIONS: None  RED FLAGS: None     WEIGHT BEARING RESTRICTIONS: No  FALLS:  Has patient fallen in last 6 months? No  LIVING ENVIRONMENT: Lives in: House/apartment Has following equipment at home: None  OCCUPATION: Investment banker, corporate.  PLOF: Independent  PATIENT GOALS: Not have pain.      OBJECTIVE:   PATIENT SURVEYS:  QUICKDASH:  38 points (61.4%).   POSTURE: forward head  PALPATION: Very tender with a great deal of tone over the patient's right UT and very tender to palpation over the right lateral epicondyle.      CERVICAL ROM:   Active right cervical rotation is 60 degrees and left is 70 degrees.  UPPER EXTREMITY MMT:  WNL for bilateral shoulders.     TREATMENT DATE:     06/21/24:   Seated UE ranger x 4 mins each side  ( 8 mins )   Yellow putty gripping x 2 mins   STW/IASTM/ TPR  x 30 min.s to patient's right UT and  light STW right  extensor forearm musculature. HMP to RT elbow  x 15 mins x 10 mins to RT Utrap                                                                                                                          05/17/24:  UBE at 120 RPM's x 8 minutes (4 mins forward and 4 mins backward) f/b STW/M x 15 minutes including ischemic release technique f/b HMP x 15 minutes.     PATIENT EDUCATION:  Education details: Discussed correct posture.   Person educated: Patient Education method: Medical Illustrator Education comprehension: verbalized understanding and returned demonstration  HOME EXERCISE PROGRAM:   ASSESSMENT:  CLINICAL IMPRESSION: Patient arrived today doing fair, but continues  to have a great deal of pain over right forearm.She is some better over RT Utrap and paraspinals, but still very tender. Rx focused again on low level therex as well as light STW. She continues to be very palpably tender over her right forearm extensor musculature. Pt did have a good release in RT Utrap, but remains tight.    OBJECTIVE IMPAIRMENTS: decreased activity tolerance, decreased ROM, increased muscle spasms, postural dysfunction, and pain.   ACTIVITY LIMITATIONS: carrying and lifting  PARTICIPATION LIMITATIONS: meal prep, cleaning, laundry, and occupation.  REHAB POTENTIAL: Good  CLINICAL DECISION MAKING: Evolving/moderate complexity  EVALUATION COMPLEXITY: Moderate   GOALS:   LONG TERM GOALS: Target date: 06/21/24  Ind with HEP.  Goal status: INITIAL  2.  Improve bilateral active cervical rotation to 75 degrees so she can turn her head more easily  while driving.  Goal status: INITIAL  3.  Perform ADL's with neck and right elbow pain not > 2-3/10.  Goal status: INITIAL  4.  Improve QUICKDASH score by at least 7 points.  Goal status: INITIAL   PLAN:  PT FREQUENCY: 2x/week  PT DURATION: 6 weeks  PLANNED INTERVENTIONS: 97110-Therapeutic exercises, 97530- Therapeutic activity, [id number]- Neuromuscular re-education, 97535- Self Care, 02859- Manual therapy, G0283- Electrical stimulation (unattended), 97035- Ultrasound, Patient/Family education, Cryotherapy, and Moist heat  PLAN FOR NEXT SESSION: Posture exercises.   No modalities,  STW/M as needed.     Nekeisha Aure,CHRIS, PTA 06/21/2024, 11:56 AM      "

## 2024-06-28 ENCOUNTER — Ambulatory Visit: Attending: Family Medicine | Admitting: *Deleted

## 2024-06-28 ENCOUNTER — Encounter: Payer: Self-pay | Admitting: *Deleted

## 2024-06-28 DIAGNOSIS — M542 Cervicalgia: Secondary | ICD-10-CM | POA: Diagnosis present

## 2024-06-28 DIAGNOSIS — M25521 Pain in right elbow: Secondary | ICD-10-CM | POA: Diagnosis present

## 2024-06-28 DIAGNOSIS — M62838 Other muscle spasm: Secondary | ICD-10-CM | POA: Insufficient documentation

## 2024-06-28 NOTE — Therapy (Signed)
 " OUTPATIENT PHYSICAL THERAPY CERVICAL TREATMENT   Patient Name: Dorothy Webster MRN: [id number] DOB:1970/08/14, 54 y.o., female Today's Date: 06/28/2024  END OF SESSION:  PT End of Session - 06/28/24 1652     Visit Number 7    Number of Visits 12    Date for Recertification  06/21/24    PT Start Time 1645    PT Stop Time 1734    PT Time Calculation (min) 49 min          History reviewed. No pertinent past medical history. History reviewed. No pertinent surgical history. There are no active problems to display for this patient.    REFERRING PROVIDER: Lamar Cornet MD  REFERRING DIAG: Trapezius muscle strain.  Lateral epicondylitis.    THERAPY DIAG:  Pain in right elbow  Other muscle spasm  Cervicalgia  Rationale for Evaluation and Treatment: Rehabilitation  ONSET DATE: August.    SUBJECTIVE:                                                                                                                                                                                                         SUBJECTIVE STATEMENT: Neck pain a 3/10  and right elbow an 5/10.    RT Utrap doing some better. Want to do MRI of neck, but can't afford it. US  of RT elbow/forearm? PERTINENT HISTORY:  HTN.    PAIN:  Are you having pain? Yes: NPRS scale: See above/10. Pain location: Right cervical, right elbow.   Pain description: As above.   Aggravating factors: As above.   Relieving factors: As above.    PRECAUTIONS: None  RED FLAGS: None     WEIGHT BEARING RESTRICTIONS: No  FALLS:  Has patient fallen in last 6 months? No  LIVING ENVIRONMENT: Lives in: House/apartment Has following equipment at home: None  OCCUPATION: Investment banker, corporate.  PLOF: Independent  PATIENT GOALS: Not have pain.      OBJECTIVE:   PATIENT SURVEYS:  QUICKDASH:  38 points (61.4%).   POSTURE: forward head  PALPATION: Very tender with a great deal of tone over the patient's right UT and very tender to  palpation over the right lateral epicondyle.     CERVICAL ROM:   Active right cervical rotation is 60 degrees and left is 70 degrees.  UPPER EXTREMITY MMT:  WNL for bilateral shoulders.     TREATMENT DATE:     06/28/24   Seated UE ranger  2 x 4 mins each side  ( 8 mins )   UBE  x 4 mins  120  RPMs  STW/ TPR  x 28 min.s to patient's right UT/ Cerv. Paras  and  light STW right extensor forearm musculature. Attempted ice massage, but to painful HMP to RT elbow  x 15 mins x 10 mins to RT Utrap                                                                                                                          05/17/24:  UBE at 120 RPM's x 8 minutes (4 mins forward and 4 mins backward) f/b STW/M x 15 minutes including ischemic release technique f/b HMP x 15 minutes.     PATIENT EDUCATION:  Education details: Discussed correct posture.   Person educated: Patient Education method: Medical Illustrator Education comprehension: verbalized understanding and returned demonstration  HOME EXERCISE PROGRAM:   ASSESSMENT:  CLINICAL IMPRESSION:  DC next visit Patient arrived today doing some better with less pain. Rx focused again on low level therex as well as light STW. She was able to perform 4 mins  on UBE today as well. She continues to be very palpably tender over her right forearm extensor musculature. Pt did have a good release in RT Utrap, but remains tight.    OBJECTIVE IMPAIRMENTS: decreased activity tolerance, decreased ROM, increased muscle spasms, postural dysfunction, and pain.   ACTIVITY LIMITATIONS: carrying and lifting  PARTICIPATION LIMITATIONS: meal prep, cleaning, laundry, and occupation.  REHAB POTENTIAL: Good  CLINICAL DECISION MAKING: Evolving/moderate complexity  EVALUATION COMPLEXITY: Moderate   GOALS:   LONG TERM GOALS: Target date: 06/21/24  Ind with HEP.  Goal status: INITIAL  2.  Improve bilateral active cervical rotation to 75 degrees  so she can turn her head more easily while driving.  Goal status: INITIAL  3.  Perform ADL's with neck and right elbow pain not > 2-3/10.  Goal status: INITIAL  4.  Improve QUICKDASH score by at least 7 points.  Goal status: INITIAL   PLAN:  PT FREQUENCY: 2x/week  PT DURATION: 6 weeks  PLANNED INTERVENTIONS: 97110-Therapeutic exercises, 97530- Therapeutic activity, [id number]- Neuromuscular re-education, 97535- Self Care, 02859- Manual therapy, G0283- Electrical stimulation (unattended), 97035- Ultrasound, Patient/Family education, Cryotherapy, and Moist heat  PLAN FOR NEXT SESSION: Posture exercises.   No modalities,  STW/M as needed.  DC next visit   Ellamay Fors,CHRIS, PTA 06/28/2024, 5:35 PM      "

## 2024-06-30 ENCOUNTER — Encounter: Payer: Self-pay | Admitting: *Deleted

## 2024-06-30 ENCOUNTER — Ambulatory Visit: Admitting: *Deleted

## 2024-06-30 DIAGNOSIS — M25521 Pain in right elbow: Secondary | ICD-10-CM

## 2024-06-30 DIAGNOSIS — M62838 Other muscle spasm: Secondary | ICD-10-CM

## 2024-06-30 DIAGNOSIS — M542 Cervicalgia: Secondary | ICD-10-CM

## 2024-06-30 NOTE — Therapy (Addendum)
 " OUTPATIENT PHYSICAL THERAPY CERVICAL TREATMENT   Patient Name: Dorothy Webster MRN: [id number] DOB:1971-01-17, 54 y.o., female Today's Date: 06/30/2024  END OF SESSION:  PT End of Session - 06/30/24 0842     Visit Number 8    Number of Visits 12    Date for Recertification  06/21/24    PT Start Time 0800    PT Stop Time 0848    PT Time Calculation (min) 48 min          History reviewed. No pertinent past medical history. History reviewed. No pertinent surgical history. There are no active problems to display for this patient.    REFERRING PROVIDER: Lamar Cornet MD  REFERRING DIAG: Trapezius muscle strain.  Lateral epicondylitis.    THERAPY DIAG:  Pain in right elbow  Other muscle spasm  Cervicalgia  Rationale for Evaluation and Treatment: Rehabilitation  ONSET DATE: August.    SUBJECTIVE:                                                                                                                                                                                                         SUBJECTIVE STATEMENT: Neck pain a 3/10  and right elbow an 5/10.    RT Utrap doing some better. Want to do MRI of neck, but can't afford it. US  of RT elbow/forearm?  DC due to insurance PERTINENT HISTORY:  HTN.    PAIN:  Are you having pain? Yes: NPRS scale: See above/10. Pain location: Right cervical, right elbow.   Pain description: As above.   Aggravating factors: As above.   Relieving factors: As above.    PRECAUTIONS: None  RED FLAGS: None     WEIGHT BEARING RESTRICTIONS: No  FALLS:  Has patient fallen in last 6 months? No  LIVING ENVIRONMENT: Lives in: House/apartment Has following equipment at home: None  OCCUPATION: Investment banker, corporate.  PLOF: Independent  PATIENT GOALS: Not have pain.      OBJECTIVE:   PATIENT SURVEYS:  QUICKDASH:  38 points (61.4%).   POSTURE: forward head  PALPATION: Very tender with a great deal of tone over the patient's right  UT and very tender to palpation over the right lateral epicondyle.     CERVICAL ROM:   Active right cervical rotation is 60 degrees and left is 70 degrees.  UPPER EXTREMITY MMT:  WNL for bilateral shoulders.     TREATMENT DATE:     06/30/24   Seated UE ranger  2 x 4 mins each side  ( 8 mins )   UBE  x 4 mins  120 RPMs Discussed HEP, TENS, and possible DN  STW/ TPR   to patient's right UT and levator as well as Cerv. Paras  and  light STW right extensor forearm musculature.  HMP to RT elbow  x 15 mins x 10 mins to RT Utrap                                                                                                                          05/17/24:  UBE at 120 RPM's x 8 minutes (4 mins forward and 4 mins backward) f/b STW/M x 15 minutes including ischemic release technique f/b HMP x 15 minutes.     PATIENT EDUCATION:  Education details: Discussed correct posture.   Person educated: Patient Education method: Medical Illustrator Education comprehension: verbalized understanding and returned demonstration  HOME EXERCISE PROGRAM:   ASSESSMENT:  CLINICAL IMPRESSION:  DC to HEP Patient arrived today doing some better with less pain and reports neck is about 30% better overall, but RT forearm is about the same.  Rx focused again on low level therex as well as light STW as well as discussion of HEP. LTG#1 met. Majority were not met due to high pain and hyper-sensitivity  RT side Utrap as well as RT forearm. Pt still unable to perform ADL's without increased pain.   OBJECTIVE IMPAIRMENTS: decreased activity tolerance, decreased ROM, increased muscle spasms, postural dysfunction, and pain.   ACTIVITY LIMITATIONS: carrying and lifting  PARTICIPATION LIMITATIONS: meal prep, cleaning, laundry, and occupation.  REHAB POTENTIAL: Good  CLINICAL DECISION MAKING: Evolving/moderate complexity  EVALUATION COMPLEXITY: Moderate   GOALS:   LONG TERM GOALS: Target date:  06/21/24  Ind with HEP.  Goal status: MET  2.  Improve bilateral active cervical rotation to 75 degrees so she can turn her head more easily while driving.  Goal status: NM  RT 60 degrees, LT 70 degrees  3.  Perform ADL's with neck and right elbow pain not > 2-3/10.  Goal status: NM   4.  Improve QUICKDASH score by at least 7 points.  Goal status: NM   41/11   PLAN:  PT FREQUENCY: 2x/week  PT DURATION: 6 weeks  PLANNED INTERVENTIONS: 97110-Therapeutic exercises, 97530- Therapeutic activity, [id number]- Neuromuscular re-education, 97535- Self Care, 02859- Manual therapy, [id number]- Electrical stimulation (unattended), 97035- Ultrasound, Patient/Family education, Cryotherapy, and Moist heat  PLAN: DC to HEP   Raffi Milstein,CHRIS, PTA 06/30/2024, 1:42 PM  PHYSICAL THERAPY DISCHARGE SUMMARY  Visits from Start of Care: 8.  Current functional level related to goals / functional outcomes: Please see above.   Remaining deficits: Continued pain.     Education / Equipment: HEP.   Patient agrees to discharge. Patient goals were partially met. Patient is being discharged due to completing course of PT   Chad Applegate MPT  "
